# Patient Record
Sex: Male | Born: 1950 | Race: Black or African American | Hispanic: No | Marital: Married | Smoking: Former smoker
Health system: Southern US, Community
[De-identification: ages and names within clinical notes are randomized; demographics above are authoritative.]

## PROBLEM LIST (undated history)

## (undated) DIAGNOSIS — R0609 Other forms of dyspnea: Secondary | ICD-10-CM

## (undated) DIAGNOSIS — I493 Ventricular premature depolarization: Secondary | ICD-10-CM

## (undated) DIAGNOSIS — R0789 Other chest pain: Secondary | ICD-10-CM

## (undated) DIAGNOSIS — I2510 Atherosclerotic heart disease of native coronary artery without angina pectoris: Secondary | ICD-10-CM

## (undated) DIAGNOSIS — I10 Essential (primary) hypertension: Secondary | ICD-10-CM

## (undated) DIAGNOSIS — E785 Hyperlipidemia, unspecified: Secondary | ICD-10-CM

## (undated) DIAGNOSIS — R0600 Dyspnea, unspecified: Secondary | ICD-10-CM

## (undated) DIAGNOSIS — E119 Type 2 diabetes mellitus without complications: Secondary | ICD-10-CM

## (undated) HISTORY — DX: Hyperlipidemia, unspecified: E78.5

## (undated) HISTORY — DX: Ventricular premature depolarization: I49.3

## (undated) HISTORY — DX: Atherosclerotic heart disease of native coronary artery without angina pectoris: I25.10

## (undated) HISTORY — DX: Essential (primary) hypertension: I10

## (undated) HISTORY — DX: Other forms of dyspnea: R06.09

## (undated) HISTORY — DX: Other chest pain: R07.89

## (undated) HISTORY — DX: Dyspnea, unspecified: R06.00

## (undated) HISTORY — DX: Type 2 diabetes mellitus without complications: E11.9

---

## 1997-09-26 ENCOUNTER — Ambulatory Visit (HOSPITAL_COMMUNITY): Admission: RE | Admit: 1997-09-26 | Discharge: 1997-09-26 | Payer: Self-pay | Admitting: Pulmonary Disease

## 1999-04-05 HISTORY — PX: SPINAL FUSION: SHX223

## 1999-06-03 ENCOUNTER — Ambulatory Visit (HOSPITAL_COMMUNITY): Admission: RE | Admit: 1999-06-03 | Discharge: 1999-06-03 | Payer: Self-pay | Admitting: Pulmonary Disease

## 2000-02-23 ENCOUNTER — Encounter: Payer: Self-pay | Admitting: Cardiology

## 2000-02-23 ENCOUNTER — Ambulatory Visit (HOSPITAL_COMMUNITY): Admission: RE | Admit: 2000-02-23 | Discharge: 2000-02-23 | Payer: Self-pay | Admitting: Cardiology

## 2000-06-06 ENCOUNTER — Ambulatory Visit (HOSPITAL_COMMUNITY): Admission: RE | Admit: 2000-06-06 | Discharge: 2000-06-06 | Payer: Self-pay | Admitting: Neurosurgery

## 2000-06-06 ENCOUNTER — Encounter: Payer: Self-pay | Admitting: Neurosurgery

## 2000-06-23 ENCOUNTER — Encounter: Payer: Self-pay | Admitting: Neurosurgery

## 2000-06-27 ENCOUNTER — Inpatient Hospital Stay (HOSPITAL_COMMUNITY): Admission: RE | Admit: 2000-06-27 | Discharge: 2000-06-28 | Payer: Self-pay | Admitting: Neurosurgery

## 2000-06-27 ENCOUNTER — Encounter: Payer: Self-pay | Admitting: Neurosurgery

## 2000-07-18 ENCOUNTER — Encounter: Payer: Self-pay | Admitting: Neurosurgery

## 2000-07-18 ENCOUNTER — Encounter: Admission: RE | Admit: 2000-07-18 | Discharge: 2000-07-18 | Payer: Self-pay | Admitting: Neurosurgery

## 2000-08-18 ENCOUNTER — Encounter: Admission: RE | Admit: 2000-08-18 | Discharge: 2000-08-18 | Payer: Self-pay | Admitting: Neurosurgery

## 2000-08-18 ENCOUNTER — Encounter: Payer: Self-pay | Admitting: Neurosurgery

## 2000-09-28 ENCOUNTER — Encounter: Admission: RE | Admit: 2000-09-28 | Discharge: 2000-09-28 | Payer: Self-pay | Admitting: Cardiology

## 2000-09-28 ENCOUNTER — Encounter: Payer: Self-pay | Admitting: Cardiology

## 2000-10-13 ENCOUNTER — Ambulatory Visit (HOSPITAL_COMMUNITY): Admission: RE | Admit: 2000-10-13 | Discharge: 2000-10-13 | Payer: Self-pay | Admitting: Cardiology

## 2000-10-13 ENCOUNTER — Encounter: Payer: Self-pay | Admitting: Cardiology

## 2001-05-23 ENCOUNTER — Ambulatory Visit (HOSPITAL_COMMUNITY): Admission: RE | Admit: 2001-05-23 | Discharge: 2001-05-23 | Payer: Self-pay | Admitting: Cardiology

## 2001-05-23 ENCOUNTER — Encounter: Payer: Self-pay | Admitting: Cardiology

## 2002-04-08 ENCOUNTER — Encounter: Admission: RE | Admit: 2002-04-08 | Discharge: 2002-07-07 | Payer: Self-pay | Admitting: Cardiology

## 2010-02-13 ENCOUNTER — Ambulatory Visit (HOSPITAL_COMMUNITY): Admission: RE | Admit: 2010-02-13 | Discharge: 2010-02-13 | Payer: Self-pay | Admitting: Family Medicine

## 2010-02-13 ENCOUNTER — Emergency Department (HOSPITAL_COMMUNITY)
Admission: EM | Admit: 2010-02-13 | Discharge: 2010-02-13 | Payer: Self-pay | Source: Home / Self Care | Admitting: Family Medicine

## 2010-08-20 NOTE — H&P (Signed)
[place]. Herbert Miller  Patient:    Herbert, Miller                      MRN: [id number] Adm. Date:  [id number] Attending:  Josie Saunders                         History and Physical  REASON FOR ADMISSION:  Herniated cervical disk with cervical spondylosis and cervical radiculopathy.  HISTORY OF PRESENT ILLNESS:  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 Kari Miller for a neurosurgical consultation for neck and left arm pain.  I initially saw him on May 10, 2000, and [name] says he cannot sleep, and that it bothers him a lot.  He has been out of work for three weeks because of neck pain.  He previously saw Dr. Oretha Caprice at [facility], who recommended that he see a neurosurgeon for further evaluation and treatment.  [name] has undergone a prior lumbar fusion for a bulging disk in his low back by Dr. Oretha Caprice in 1996.  He says he did okay with that until about one year ago, and then has been having more left leg pain.  [name] currently complains of constant pain in his neck for greater than one year.  He says that it began in 2001, and then subsequently he developed left arm pain.  He says it has been very bad from October through December 2001, that it goes all the way down his arm, along with numbness in his left arm.  He says he has some pain in his legs which he feels is from his low back, along with numbness, but he says that when he walks this is relieved.  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 This occurred in 1979.  He had four surgeries on his hands to have his digits reattached.  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  He has healed from his hand quite well.  [name] was previously treated by Dr. Jearld Adjutant, who sent him to Dr. Oretha Caprice.  He  sees Dr. Eduardo Osier. Harwani for his heart, and has a history of angina.  He has also noted an increased heart rate.  He is diabetic.  [name] presented in the office on May 10, 2000, with an MRI of his cervical spine and of his left shoulder.  The cervical MRI was incomplete.  It was performed at [facility].  It showed multilevel spondylitic disease, particularly at the C5-6 and C6-7 levels, with questionable disease at the C4-5 level.  He did not have any reports from that study.  They were performed at the [facility] on February 08, 2000.  There is some stenosis at the C5-6 and C6-7 levels, without frank cord compression.  REVIEW OF SYSTEMS:  A detailed review of systems sheet was reviewed with the patient.  The pertinent positives include:  EARS/NOSE/MOUTH/THROAT:  He notes a sore throat.  CARDIOVASCULAR:  He notes chest pain, angina, high blood pressure, heart murmur, high cholesterol, swelling in his feet and hands, leg pain with walking.  RESPIRATORY:  He notes shortness of breath. GASTROINTESTINAL:  He notes indigestion or pain with eating, abdominal pain, change in his bowel habits.  GENITOURINARY:  He notes  difficulty starting and stopping his stream.  MUSCULOSKELETAL:  He notes leg weakness, back pain, arm pain, leg pain, neck pain.  NEUROLOGIC:  He notes problems with his memory and ability to concentrate, double and blurred vision.  PSYCHIATRIC:  He notes anxiety and depression.  ENDOCRINE:  Diabetes and excessive thirst and urination.  All other systems are negative.  PAST MEDICAL HISTORY: 1. High blood pressure. 2. Prior heart attack. 3. Diabetes.  PRIOR OPERATIONS/HOSPITALIZATIONS:  Significant for multilevel lumbar fusion with pedicular fixation, what appears to be Steffe plates in July 8295.  CURRENT MEDICATIONS: 1. K-Dur 20 mEq, two tablets q.d. 2. Lopressor once q.d. for high blood pressure. 3. Lipitor once q.d. 4. Norvasc once q.d. 5. Trazodone  one to two tablets at bedtime. 6. Hydrocodone one q.8h. p.r.n. pain.  ALLERGIES:  No known drug allergies.  HEIGHT & WEIGHT:  [name] is 6 feet 1 inch tall, weighing 225 pounds.  FAMILY HISTORY:  Mother is deceased, cause unspecified.  Father is age 73, in fair health with high blood pressure and diabetes.  SOCIAL HISTORY:  [name] is a four-cigarette-a-day smoker, a nondrinker of alcoholic beverages.  No history of substance abuse.  He works in transportation at [employer], and was out of work for three weeks prior to coming to see me.  DIAGNOSTIC STUDIES:  As above.  PHYSICAL EXAMINATION:  GENERAL:  [name] is a pleasant cooperative middle-aged black male, in no acute distress.  VITAL SIGNS:  Blood pressure 170/100 left arm seated, pulse 80 and regular.  HEENT:  Normocephalic, atraumatic.  Pupils equal, round, reactive to light. Extraocular movements intact.  Sclerae white.  Conjunctivae pink.  Oropharynx benign.  Uvula midline.  NECK:  No masses, meningismus, deformities, tracheal deviation, jugular venous distention, or carotid bruits.  He has a decreased range of motion on turning his head to the left, positive left-sided Spurlings maneuver, negative to the right.  Positive Lhermittes sign with axial compression causing an electrical shock into the left arm.  RESPIRATORY:  There is normal respiratory effort with good intercostal function.  The lungs are clear to auscultation.  There are no rales, rhonchi, or wheezes.  CARDIOVASCULAR:  The heart has a regular rate and rhythm to auscultation.  No murmurs are appreciated.  EXTREMITIES:  There is no cyanosis, clubbing, or edema.  There are palpable pedal pulses.  ABDOMEN:  Soft, nontender.  No hepatosplenomegaly appreciated, no masses. There are active bowel sounds.  No guarding or rebound.  MUSCULOSKELETAL:  He has had reattachment of his third through fifth digits on the left hand.  He had an  amputation of his second digit on the right.  He has negative shoulder impingement testing bilaterally.  Straight leg raise is positive at 60 degrees on the left.   NEUROLOGIC:  The patient is oriented to time, person, and place.  He has good recall of both recent and remote memory, with normal attention span and concentration.  The patient speaks with clear and fluent speech, and exhibits normal language function, and appropriate fund of knowledge.  Cranial nerve examination:  Pupils equal, round, reactive to light.  Extraocular movements are full.  Visual fields are full to confrontational testing.  Facial sensation and facial motor are intact and symmetric.  Hearing is intact to finger rub.  ______ is upgoing.  Shoulder shrug is symmetric.  Tongue protrudes in the midline.  Motor examination:  Motor strength is 5/5 in the bilateral hand grips, wrist extensors, interosseous, and  5/5 in the right deltoid, biceps, and triceps, and left deltoid is 4/5, left biceps 4/5, left triceps at 4-/5.  In the lower extremities motor strength is 5/5 in hip flexion, extension, quadriceps, hamstrings, plantar flexion, dorsiflexion, and extensor hallucis longus.  Sensory examination:  Normal to light touch and pinprick sensation in the upper and lower extremities, with the exception of decreased pin sensation in the left L5 distribution.  Deep tendon reflexes are 2 in the biceps and triceps on the right, absent on the left biceps, absent in the left triceps.  Knee jerks are absent.  Ankles jerks are 1 on the right and absent on the left.  Great toes are downgoing to plantar stimulation. Negative Hoffmans sign.  Cerebellar examination:  Normal coordination in the upper and lower extremities.  Normal rapid alternating movements.  Romberg test is negative.  IMPRESSION/RECOMMENDATIONS:  Mr. Rashan Rounsaville is a 60 year old man with neck and left upper extremity complaints.  He has weakness in his left  upper extremity, positive Spurlings maneuver, and appears to have multilevel disease in his cervical spine.  Because of the incomplete MRI, and we were unable to obtain the repeat copy of the MRI, went ahead and did a cervical myelography.  The cervical myelogram and post-myelographic CT demonstrates significant degenerative disease at the C4-5, C5-6, and C6-7 levels, most severe at the C5-6 and C6-7 levels.  Based on those imaging studies, as well as significant radicular complaints involving the C5, C6, and C7 nerve roots, I have recommended that he undergo an anterior cervical diskectomy and fusion at the C4-5, C5-6, and C6-7 levels. I went over the diagnostic studies in detail with him, and reviewed the surgical models and also discussed the exact nature of the surgical procedure, attendant risks, essential benefits, and typical operative and postoperative course.  I discussed the risks of surgery, which include but are not limited to, the risks of anesthesia, blood loss, infection, injury to various neck structures including trachea and esophagus which could cause either temporary or permanent swallowing difficulties, and also the potential for perforation of the esophagus which might require operative intervention, larynx, recurrent laryngeal nerve which could cause either temporary or permanent vocal cord paralysis resulting in either temporary or permanent voice changes, injury to the cervical nerve roots, which could cause either temporary or permanent arm pain, numbness, and/or weakness.  There is a small chance of injury to the spinal cord which could cause paralysis.  There is also the potential for malplacement of instrumentation, fusion failure, need for repeat surgery, degenerative disease at other levels in the neck, failure to relieve pain, and worsening of the pain.  I also discussed with the patient that he will lose some neck mobility from his surgery, his typical stay  in the hospital overnight after this operation, and he will not be able to drive for two weeks after surgery, and will come back to see me two weeks following surgery, and then with a lateral C-spine x-ray, and then for monthly visits for up to three months after surgery.  I went over the patients lumbar myelogram and post-myelographic CT.  I told him that this did not show any significant abnormalities in nerve root filling, or abnormalities of the lumbar fusion.  He wished to go ahead with the surgery.  This is scheduled for June 27, 2000. DD:  06/27/00 TD:  06/27/00 Job: [id number]

## 2010-08-20 NOTE — Op Note (Signed)
[place]. [facility]  Patient:    Herbert Miller, Herbert Miller                      MRN: [id number] Proc. Date: 06/27/00 Adm. Date:  [id number] Attending:  Josie Saunders                           Operative Report  PREOPERATIVE DIAGNOSES:  Herniated cervical disk with cervical spondylosis and degenerative disk disease, cervical radiculopathy at C4-5 at C5-6, and C6-7 levels.  POSTOPERATIVE DIAGNOSES:  Herniated cervical disk with cervical spondylosis and degenerative disk disease, cervical radiculopathy at C4-5 at C5-6, and C6-7 levels.  PROCEDURE:  Anterior cervical diskectomy and fusion, C4-5, C5-6, and C6-7 levels with allograft bone graft and an anterior cervical plate.  SURGEON:  Danae Orleans. Venetia Maxon, M.D.  ASSISTANT:  Stefani Dama, M.D.  ANESTHESIA:  General endotracheal anesthesia.  ESTIMATED BLOOD LOSS:  100 cc.  COMPLICATIONS:  None.  DISPOSITION:  Recovery.  INDICATIONS:  Herbert Miller is a 60 year old man with neck and left upper extremity pain and weakness with significant degenerative disease involving the C4-5 to a lesser degree and at the C5-6 and C6-7 levels to a greater degree with spondylosis and foraminal stenosis along with spinal stenosis at these levels.  We have elected to take him to surgery for an anterior cervical diskectomy and fusion.  PROCEDURE:  Herbert Miller was brought to the operating room.  Following a satisfactory and uncomplicated induction of general endotracheal anesthesia and placement of intravenous line the patient was placed in a supine position on the operating table.  His neck was placed in slight  extension.  He was placed in 10 pounds of Holter traction.  His anterior neck was then prepped and draped in the usual sterile fashion.  The area of planned incision was infiltrated with 0.25% Marcaine and 0.50% lidocaine and 1:200,000 epinephrine. An incision was made from the midline to the anterior border of  the sternocleidomastoid muscle carried sharply through platysmal layer. Subplatysmal dissection was performed and then blunt dissection was performed along the anterior border of the sternocleidomastoid muscle keeping the trachea and esophagus medial and the carotid sheath lateral exposing the anterior cervical spine.  A spinal needle was placed at was felt to be the C4-5 level and this was confirmed on intraoperative x-ray.  The longus coli muscles were then taken down from the anterior cervical spine from C4 to C7 levels bilaterally with electrocautery and a key elevator.  The shadowline self-retaining retractor was placed to facilitate exposure keeping the retractor blade under the cuffs of the longus coli muscles.  Initially the C5-6 and C6-7 levels were operated on.  Large ventral osteophytes were removed with leksell rongeur and the disk space at each of these levels was incised with a 15 blade and disk material was removed in a piecemeal fashion.  Subsequently using a variety of Carlen microcurets the end plates were decorticated and remaining disk material was removed down to the level of the posterior longitudinal ligament.  Disk space spreaders were placed to facilitate exposure.  Initially at the C6-7 level the end plates were drilled with the L8 end bur and minimax drill under microscopic visualization.  Subsequently uncinate spurs were drilled down and the hypertrophied redundant posterior longitudinal ligament was then removed in a piecemeal fashion decompressing the central spinal cord dura, and subsequently the C7 nerve roots widely as they extended out  the neural foramina.  Great care was taken to decompress the left C7 nerve root which was significantly compressed by a large uncinate spur which was drilled away.  Subsequently hemostasis was assured with Gelfoam soaked in thrombin and a 7 mm allograft iliac crest bone graft was then cut to the appropriate depth, then  inserted and countersunk appropriately in the C6-7 interspace.  Attention was then turned to the C5-6 level where a similar decompression was performed.  Again, large uncinate spurs were drilled down with high-speed drill.  The C6 nerve roots were decompressed widely as they extended out the neural foramina.  A similarly sized allograft was then inserted and countersunk appropriately.  Attention was then turned to the C4-5 level where ventral osteophytes were removed.  Disk space was incised with a 15 blade.  Disk material was removed in a piecemeal fashion.  This level was not as degenerated as the other levels but there was a significant ventral shelf of osteophytic bone which was removed.  The disk space spreaders were placed and using microscopic visualization the end plates of C4 and C5 were decorticated with a high-speed drill and posterior longitudinal ligament was incised and removed in a piecemeal fashion using a variety of gold tip Kerrison rongeurs.  Care was taken not to instrument the C4-5 neural foramina out of concern for the fragility C5 nerve roots bilaterally.  Nonetheless the foramina were felt to be well decompressed.  Hemostasis was again assured with Gelfoam soaked in thrombin and a similarly size 7 mm bone graft was inserted and countersunk appropriately.  The patient was taken out of 10 pounds of Holter traction and a 52 mm tether anterior cervical plate was then sized and found to fit appropriately overlying the C4 through C7 vertebral levels.  This was affixed to the anterior cervical spine with 2, 14 x 4 mm variable angled screws at C4, 2 similarly sized screws at C7, and a single intervening screw at C5 and C6 levels.  All screws had excellent purchase, locking mechanisms were engaged.  The final x-ray confirmed positioning of bone graft of the anterior cervical plate.  The self-retaining retractor was removed.  Microscope was taken out of the field.  Using  great care the soft tissues were inspected and found to be in good repair. Hemostasis was assured and the wound was copiously irrigated with bacitracin and saline.  There was no evidence of any residual bleeding.   The platysmal layer was then closed with 3-0 Vicryl sutures.  Subcuticular layers were reapproximated with 4-0 Vicryl subcuticular stitch.  The wound was dressed with benzoin and Steri-Strips, Telfa gauze and tape.  The patient was extubated in the operating room and taken to the recovery room in stable satisfactory condition having tolerated his operation well.  Counts were correct at the end of the case. DD:  06/27/00 TD:  06/27/00 Job: [id number]

## 2011-07-01 IMAGING — CR DG HAND COMPLETE 3+V*L*
3 series · 3 of 3 positions shown · non-contrast
Comparison: None.

CLINICAL DATA: Left hand pain and swelling.  Finger stiffness.

LEFT HAND - COMPLETE 3+ VIEW

[x hand pa left]
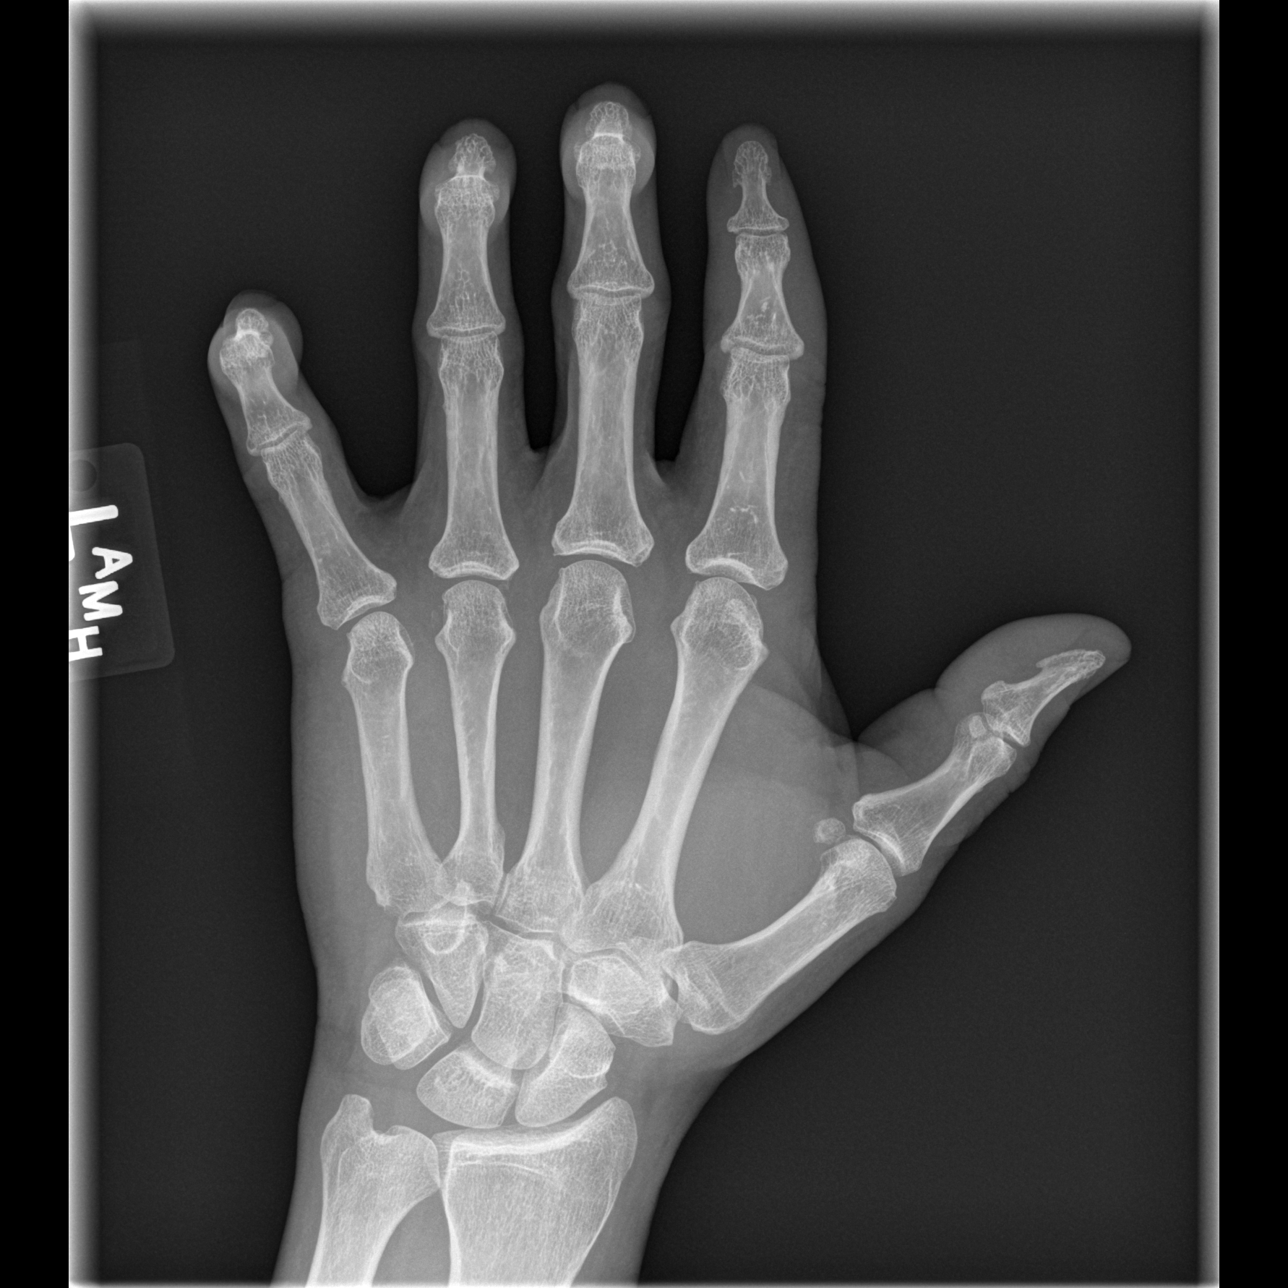

[x hand oblique left]
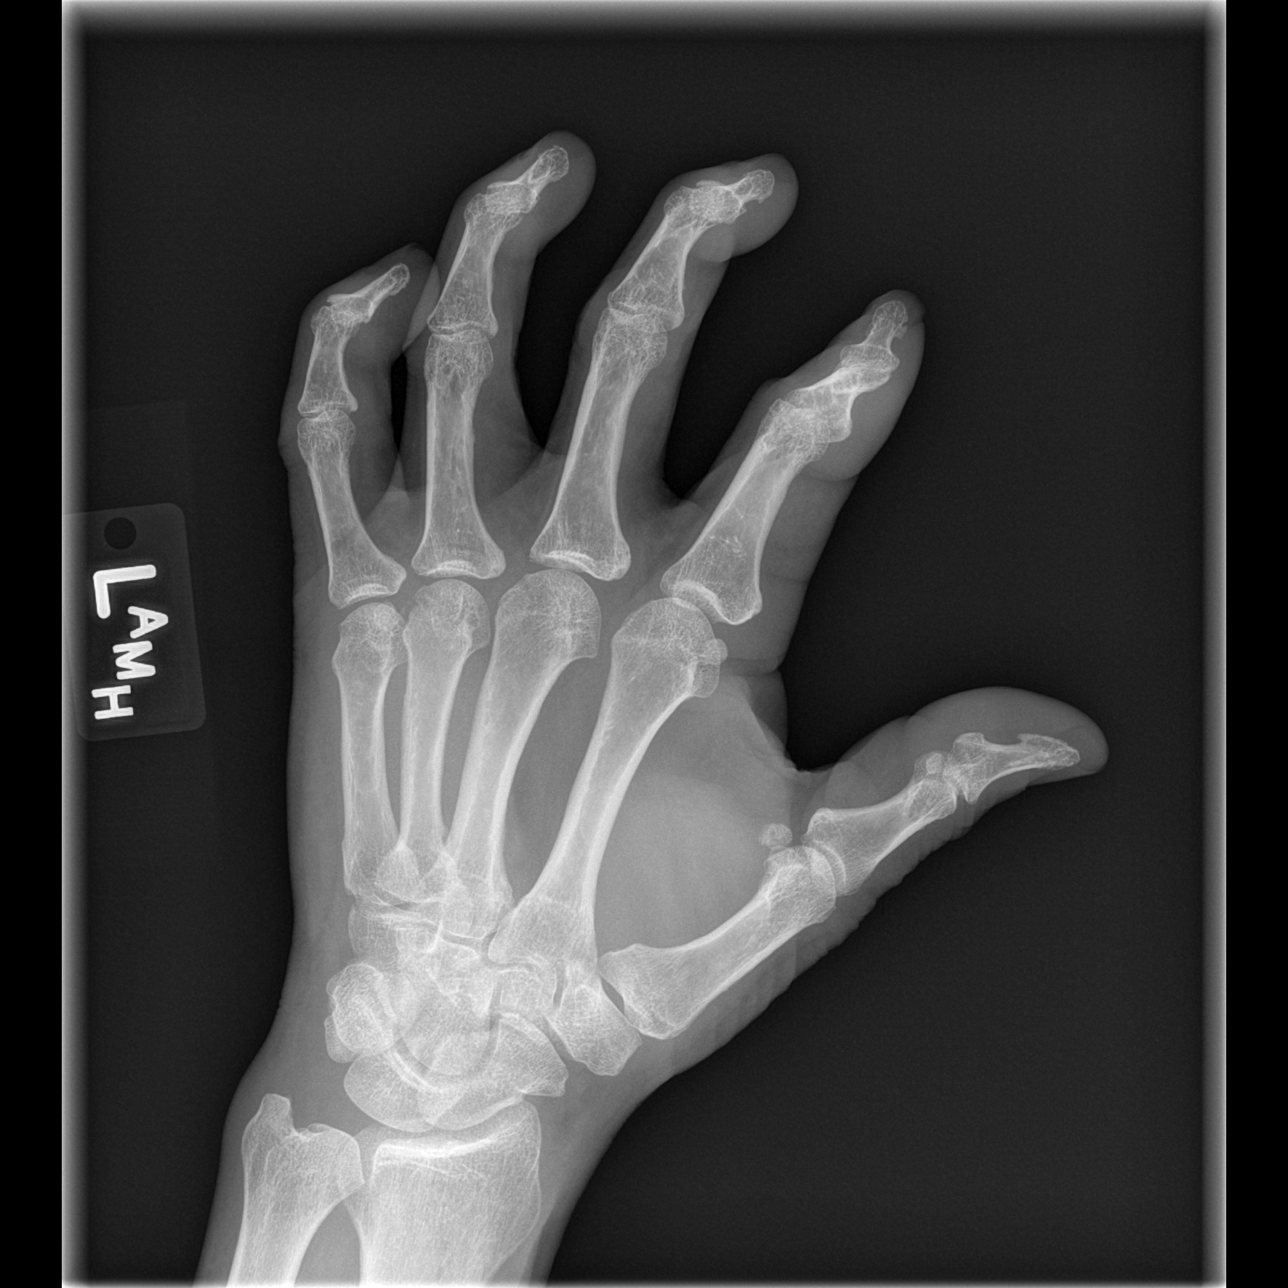

[x hand lat left]
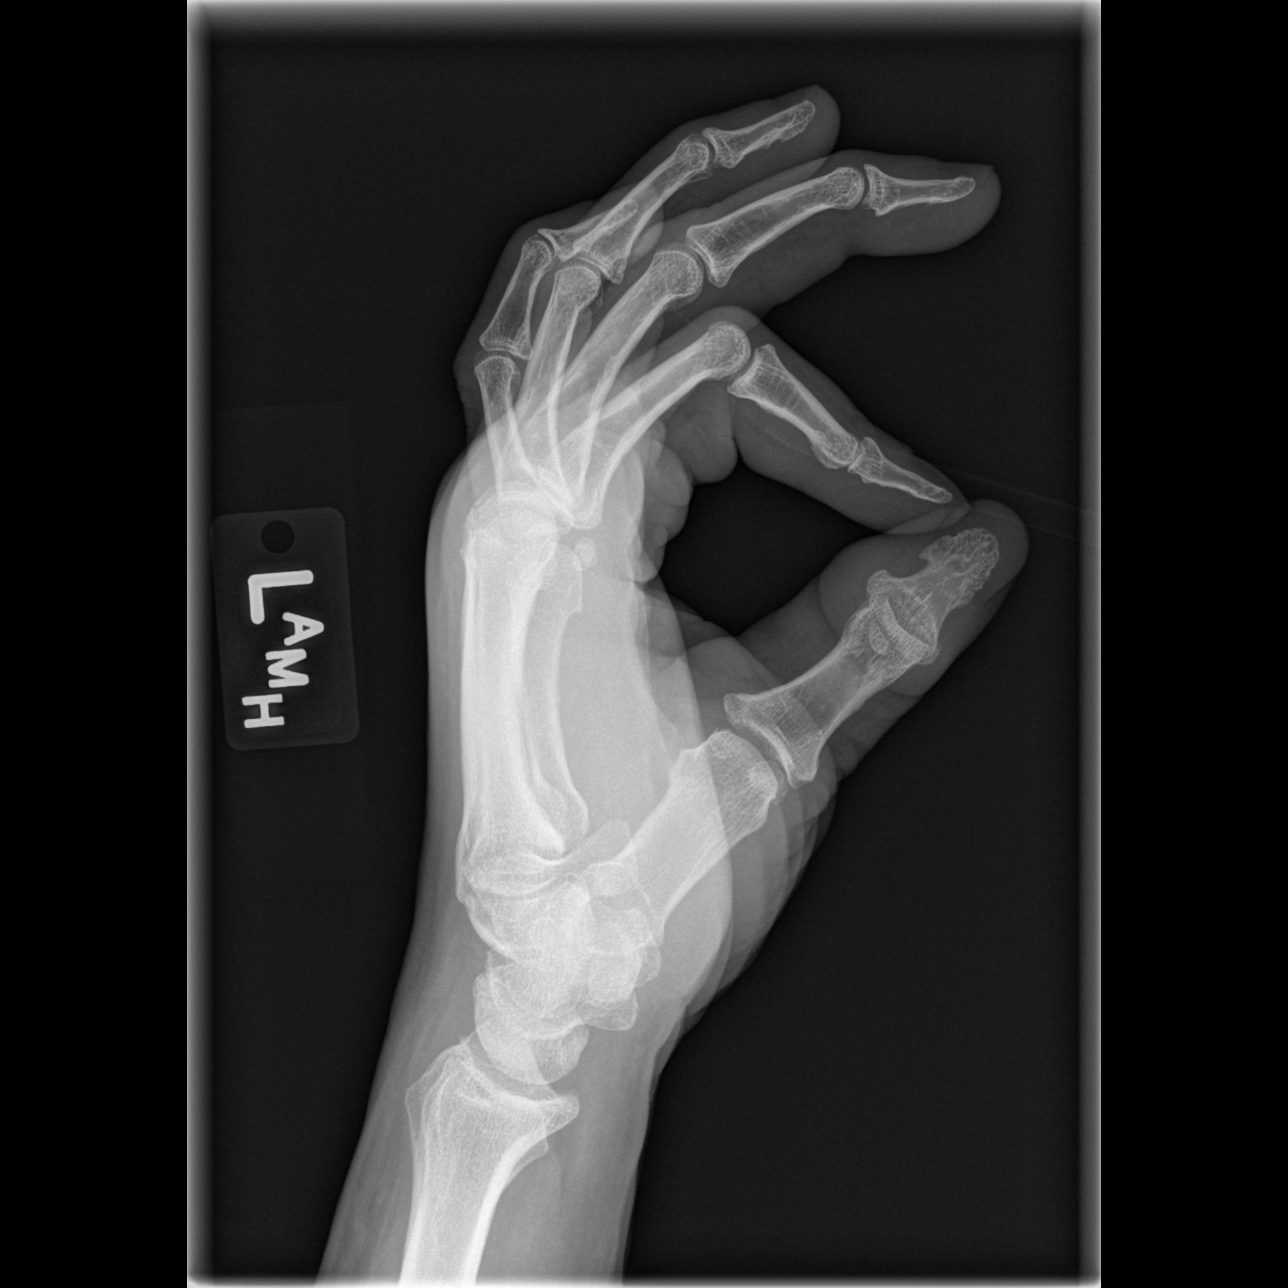

[3 of 3 positions shown; findings below may reference images not displayed]

FINDINGS: No evidence for acute fracture.  No subluxation or
dislocation.  No worrisome lytic or sclerotic osseous abnormality.
IMPRESSION: No acute bony findings.

## 2012-02-24 ENCOUNTER — Other Ambulatory Visit (HOSPITAL_COMMUNITY): Payer: Self-pay | Admitting: Cardiovascular Disease

## 2012-02-24 DIAGNOSIS — R0989 Other specified symptoms and signs involving the circulatory and respiratory systems: Secondary | ICD-10-CM

## 2012-02-24 DIAGNOSIS — I10 Essential (primary) hypertension: Secondary | ICD-10-CM

## 2012-02-24 DIAGNOSIS — R9431 Abnormal electrocardiogram [ECG] [EKG]: Secondary | ICD-10-CM

## 2012-02-28 ENCOUNTER — Ambulatory Visit (HOSPITAL_COMMUNITY)
Admission: RE | Admit: 2012-02-28 | Discharge: 2012-02-28 | Disposition: A | Discharge status: Home / Self Care | Payer: BC Managed Care – PPO | Source: Ambulatory Visit | Attending: Cardiovascular Disease | Admitting: Cardiovascular Disease

## 2012-02-28 DIAGNOSIS — I379 Nonrheumatic pulmonary valve disorder, unspecified: Secondary | ICD-10-CM | POA: Insufficient documentation

## 2012-02-28 DIAGNOSIS — E119 Type 2 diabetes mellitus without complications: Secondary | ICD-10-CM | POA: Insufficient documentation

## 2012-02-28 DIAGNOSIS — I079 Rheumatic tricuspid valve disease, unspecified: Secondary | ICD-10-CM | POA: Insufficient documentation

## 2012-02-28 DIAGNOSIS — R0989 Other specified symptoms and signs involving the circulatory and respiratory systems: Secondary | ICD-10-CM | POA: Insufficient documentation

## 2012-02-28 DIAGNOSIS — I10 Essential (primary) hypertension: Secondary | ICD-10-CM | POA: Insufficient documentation

## 2012-02-28 DIAGNOSIS — I252 Old myocardial infarction: Secondary | ICD-10-CM | POA: Insufficient documentation

## 2012-02-28 DIAGNOSIS — E785 Hyperlipidemia, unspecified: Secondary | ICD-10-CM | POA: Insufficient documentation

## 2012-02-28 DIAGNOSIS — I2510 Atherosclerotic heart disease of native coronary artery without angina pectoris: Secondary | ICD-10-CM | POA: Insufficient documentation

## 2012-02-28 DIAGNOSIS — I080 Rheumatic disorders of both mitral and aortic valves: Secondary | ICD-10-CM | POA: Insufficient documentation

## 2012-02-28 DIAGNOSIS — R0609 Other forms of dyspnea: Secondary | ICD-10-CM | POA: Insufficient documentation

## 2012-02-28 NOTE — Progress Notes (Signed)
[facility]   2D echo completed 02/28/2012.   Cindy Irven Ingalsbe, RDCS   

## 2012-03-13 ENCOUNTER — Encounter (HOSPITAL_COMMUNITY): Payer: Self-pay

## 2012-04-10 ENCOUNTER — Encounter (HOSPITAL_COMMUNITY): Payer: Self-pay

## 2012-11-29 ENCOUNTER — Ambulatory Visit: Payer: BC Managed Care – PPO | Admitting: Cardiology

## 2013-01-22 ENCOUNTER — Encounter: Payer: Self-pay | Admitting: Cardiovascular Disease

## 2015-04-02 DIAGNOSIS — C342 Malignant neoplasm of middle lobe, bronchus or lung: Secondary | ICD-10-CM | POA: Insufficient documentation

## 2015-04-02 DIAGNOSIS — E0965 Drug or chemical induced diabetes mellitus with hyperglycemia: Secondary | ICD-10-CM | POA: Insufficient documentation

## 2015-04-02 DIAGNOSIS — I5032 Chronic diastolic (congestive) heart failure: Secondary | ICD-10-CM | POA: Insufficient documentation

## 2015-04-02 DIAGNOSIS — C7931 Secondary malignant neoplasm of brain: Secondary | ICD-10-CM | POA: Insufficient documentation

## 2015-04-02 DIAGNOSIS — E785 Hyperlipidemia, unspecified: Secondary | ICD-10-CM | POA: Insufficient documentation

## 2015-04-02 DIAGNOSIS — I10 Essential (primary) hypertension: Secondary | ICD-10-CM | POA: Insufficient documentation

## 2015-04-24 DIAGNOSIS — C7951 Secondary malignant neoplasm of bone: Secondary | ICD-10-CM | POA: Insufficient documentation

## 2015-07-30 DIAGNOSIS — E876 Hypokalemia: Secondary | ICD-10-CM | POA: Insufficient documentation

## 2016-01-15 DIAGNOSIS — R29898 Other symptoms and signs involving the musculoskeletal system: Secondary | ICD-10-CM | POA: Insufficient documentation

## 2016-01-25 DIAGNOSIS — M48061 Spinal stenosis, lumbar region without neurogenic claudication: Secondary | ICD-10-CM | POA: Insufficient documentation

## 2016-01-25 DIAGNOSIS — R269 Unspecified abnormalities of gait and mobility: Secondary | ICD-10-CM | POA: Insufficient documentation

## 2016-01-25 DIAGNOSIS — R29898 Other symptoms and signs involving the musculoskeletal system: Secondary | ICD-10-CM | POA: Insufficient documentation

## 2016-01-25 DIAGNOSIS — Z981 Arthrodesis status: Secondary | ICD-10-CM | POA: Insufficient documentation

## 2016-02-12 DIAGNOSIS — G4700 Insomnia, unspecified: Secondary | ICD-10-CM | POA: Insufficient documentation

## 2016-02-12 DIAGNOSIS — G4733 Obstructive sleep apnea (adult) (pediatric): Secondary | ICD-10-CM | POA: Insufficient documentation

## 2016-02-12 DIAGNOSIS — Z8679 Personal history of other diseases of the circulatory system: Secondary | ICD-10-CM | POA: Insufficient documentation

## 2016-02-12 DIAGNOSIS — K219 Gastro-esophageal reflux disease without esophagitis: Secondary | ICD-10-CM | POA: Insufficient documentation

## 2016-02-19 DIAGNOSIS — I6789 Other cerebrovascular disease: Secondary | ICD-10-CM | POA: Insufficient documentation

## 2016-02-19 DIAGNOSIS — G936 Cerebral edema: Secondary | ICD-10-CM | POA: Insufficient documentation

## 2016-02-19 DIAGNOSIS — Y842 Radiological procedure and radiotherapy as the cause of abnormal reaction of the patient, or of later complication, without mention of misadventure at the time of the procedure: Secondary | ICD-10-CM

## 2016-03-17 DIAGNOSIS — R4182 Altered mental status, unspecified: Secondary | ICD-10-CM | POA: Insufficient documentation

## 2016-04-15 LAB — HEPATIC FUNCTION PANEL
ALK PHOS: 67 U/L (ref 25–125)
ALT: 26 U/L (ref 10–40)
AST: 16 U/L (ref 14–40)
Bilirubin, Total: 0.7 mg/dL

## 2016-04-15 LAB — BASIC METABOLIC PANEL
BUN: 40 mg/dL — AB (ref 4–21)
CREATININE: 1 mg/dL (ref 0.6–1.3)
Glucose: 259 mg/dL
POTASSIUM: 4.4 mmol/L (ref 3.4–5.3)
SODIUM: 137 mmol/L (ref 137–147)

## 2016-04-15 LAB — POCT INR: INR: 1.1 (ref 0.9–1.1)

## 2016-04-19 LAB — BASIC METABOLIC PANEL
BUN: 27 mg/dL — AB (ref 4–21)
CREATININE: 1 mg/dL (ref 0.6–1.3)
Glucose: 166 mg/dL
Potassium: 3.5 mmol/L (ref 3.4–5.3)
SODIUM: 140 mmol/L (ref 137–147)

## 2016-04-19 LAB — HEPATIC FUNCTION PANEL
ALK PHOS: 58 U/L (ref 25–125)
ALT: 41 U/L — AB (ref 10–40)
AST: 29 U/L (ref 14–40)
Bilirubin, Total: 0.6 mg/dL

## 2016-04-19 LAB — CBC AND DIFFERENTIAL
HCT: 43 % (ref 41–53)
HEMOGLOBIN: 14.5 g/dL (ref 13.5–17.5)
Neutrophils Absolute: 4 /uL
PLATELETS: 156 10*3/uL (ref 150–399)
WBC: 6.1 10^3/mL

## 2016-04-20 LAB — BASIC METABOLIC PANEL
BUN: 27 mg/dL — AB (ref 4–21)
CREATININE: 0.8 mg/dL (ref 0.6–1.3)
GLUCOSE: 253 mg/dL
POTASSIUM: 3.6 mmol/L (ref 3.4–5.3)
SODIUM: 140 mmol/L (ref 137–147)

## 2016-04-20 LAB — CBC AND DIFFERENTIAL
HEMATOCRIT: 42 % (ref 41–53)
Hemoglobin: 14.5 g/dL (ref 13.5–17.5)
Platelets: 146 10*3/uL — AB (ref 150–399)
WBC: 6.8 10^3/mL

## 2016-04-21 LAB — BASIC METABOLIC PANEL
BUN: 28 mg/dL — AB (ref 4–21)
Creatinine: 0.8 mg/dL (ref 0.6–1.3)
GLUCOSE: 191 mg/dL
Potassium: 3.7 mmol/L (ref 3.4–5.3)
SODIUM: 141 mmol/L (ref 137–147)

## 2016-04-21 LAB — CBC AND DIFFERENTIAL
HEMATOCRIT: 42 % (ref 41–53)
Hemoglobin: 14.1 g/dL (ref 13.5–17.5)
PLATELETS: 134 10*3/uL — AB (ref 150–399)
WBC: 8.5 10*3/mL

## 2016-04-22 LAB — BASIC METABOLIC PANEL
BUN: 22 mg/dL — AB (ref 4–21)
CREATININE: 0.7 mg/dL (ref 0.6–1.3)
Glucose: 137 mg/dL
Potassium: 3.3 mmol/L — AB (ref 3.4–5.3)
Sodium: 140 mmol/L (ref 137–147)

## 2016-04-22 LAB — CBC AND DIFFERENTIAL
HEMATOCRIT: 43 % (ref 41–53)
Hemoglobin: 14.4 g/dL (ref 13.5–17.5)
PLATELETS: 122 10*3/uL — AB (ref 150–399)
WBC: 7.9 10*3/mL

## 2016-04-25 ENCOUNTER — Encounter: Payer: Self-pay | Admitting: Adult Health

## 2016-04-25 ENCOUNTER — Non-Acute Institutional Stay (SKILLED_NURSING_FACILITY): Payer: Medicare Other | Admitting: Adult Health

## 2016-04-25 DIAGNOSIS — C7931 Secondary malignant neoplasm of brain: Secondary | ICD-10-CM | POA: Diagnosis not present

## 2016-04-25 DIAGNOSIS — Z7409 Other reduced mobility: Secondary | ICD-10-CM | POA: Insufficient documentation

## 2016-04-25 DIAGNOSIS — E0965 Drug or chemical induced diabetes mellitus with hyperglycemia: Secondary | ICD-10-CM

## 2016-04-25 DIAGNOSIS — E876 Hypokalemia: Secondary | ICD-10-CM

## 2016-04-25 DIAGNOSIS — I6789 Other cerebrovascular disease: Secondary | ICD-10-CM

## 2016-04-25 DIAGNOSIS — Y842 Radiological procedure and radiotherapy as the cause of abnormal reaction of the patient, or of later complication, without mention of misadventure at the time of the procedure: Secondary | ICD-10-CM

## 2016-04-25 DIAGNOSIS — I110 Hypertensive heart disease with heart failure: Secondary | ICD-10-CM | POA: Diagnosis not present

## 2016-04-25 DIAGNOSIS — K219 Gastro-esophageal reflux disease without esophagitis: Secondary | ICD-10-CM | POA: Diagnosis not present

## 2016-04-25 DIAGNOSIS — C342 Malignant neoplasm of middle lobe, bronchus or lung: Secondary | ICD-10-CM

## 2016-04-25 NOTE — Progress Notes (Signed)
Location:  [place] Room Number: [place] of Service:  SNF (31)   CODE STATUS: Full Code  No Known Allergies  Chief Complaint  Patient presents with  . 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 Prior to his hospitalization he had increasing weakness; increased incontinence and headache. He is unable to fully participate in the hpi or ros. There are no nursing concerns at this time.    Past Medical History:  Diagnosis Date  . CAD (coronary artery disease)   . Chest pain, atypical   . Dyspnea on exertion    2D Echo, 02/28/2012 - EF 60-65%, severe concentric hypertrophy, aortic valve moderately calcified,   . Hyperlipidemia   . Hypertension    Renal Doppler, 02/09/2009 - normal  . Non-insulin dependent type 2 diabetes mellitus ([place])   . PVC (premature ventricular contraction)    NUC, 02/09/2009 - No scintigraphic eivdence of inducible moycardial ischemia, post-stress EF 55%, EKG negative for ischemia, ne ECG changes    Past Surgical History:  Procedure Laterality Date  . SPINAL FUSION  2001    Social History   Social History  . Marital status: Single    Spouse name: N/A  . Number of children: N/A  . Years of education: N/A   Occupational History  . Not on file.   Social History Main Topics  . Smoking status: Former Research scientist (life sciences)  . Smokeless tobacco: Never Used  . Alcohol use No  . Drug use: No  . Sexual activity: Not Currently   Other Topics Concern  . Not on file   Social History Narrative  . No narrative on file   Family History  Problem Relation Age of Onset  . Stroke Mother 23  . Diabetes Mother   . Diabetes Sister   . Diabetes Brother   . Diabetes Brother   . Diabetes Sister       VITAL SIGNS BP (!) 160/85   Pulse 86   Temp 97.1 F (36.2 C)   Resp 16   SpO2 95%   Patient's  Medications  New Prescriptions   No medications on file  Previous Medications   AMLODIPINE (NORVASC) 10 MG TABLET    Take 10 mg by mouth daily.   ATORVASTATIN (LIPITOR) 40 MG TABLET    Take 40 mg by mouth daily.   BIOTIN 5 MG CAPS    Take 5 mg by mouth.   CALCIUM CARBONATE (CALCIUM 600) 600 MG TABS TABLET    Take 1,500 mg by mouth.   CANAGLIFLOZIN (INVOKANA) 100 MG TABS TABLET    Take 100 mg by mouth.   CHOLECALCIFEROL (VITAMIN D-1000 MAX ST) 1000 UNITS TABLET    Take by mouth.   DEXAMETHASONE (DECADRON) 2 MG TABLET    Take '4mg'$  (2 tabs) in the morning and '2mg'$  (1 tab) in the evening  1/19-1/24, then '2mg'$  twice daily 1/25-1/31, then '2mg'$  daily 2/1-2/7, then '2mg'$  every other day 2/8-2/14, then stop.   FOLIC ACID (FOLVITE) 623 MCG TABLET    Take by mouth.   INSULIN GLARGINE (LANTUS) 100 UNIT/ML INJECTION    Inject 10 Units into the skin at bedtime.   INSULIN LISPRO (HUMALOG) 100 UNIT/ML INJECTION    Inject into the skin.   LOSARTAN (COZAAR) 100 MG TABLET    Take 100 mg by mouth daily.  METFORMIN (GLUCOPHAGE) 500 MG TABLET    Take 500 mg by mouth 2 (two) times daily with a meal.   METOPROLOL (TOPROL-XL) 200 MG 24 HR TABLET    Take 200 mg by mouth.   ONDANSETRON (ZOFRAN) 8 MG TABLET    TAKE 1 TABLET BY MOUTH 2 TIMES DAILY FOR 2 DAYS AFTER CHEMOTHERAPY, THEN 1 TABLET 2 TIMES DAILY FOR NAUSEA AS NEEDED   PANTOPRAZOLE (PROTONIX) 40 MG TABLET    Take 40 mg by mouth.   POTASSIUM CHLORIDE SA (K-DUR,KLOR-CON) 20 MEQ TABLET    Take 20 mEq by mouth daily.   SENNA-DOCUSATE (SENOKOT-S) 8.6-50 MG TABLET    Take 2 tablets by mouth at bedtime.   SPECIALTY VITAMINS PRODUCTS (MG-PLUS PROTEIN PO)    1 capsule with a meal   TRIAMTERENE-HYDROCHLOROTHIAZIDE (MAXZIDE-25) 37.5-25 MG PER TABLET    Take 1 tablet by mouth daily.  Modified Medications   No medications on file  Discontinued Medications   CLONIDINE (CATAPRES) 0.1 MG TABLET    Take 0.1 mg by mouth 2 (two) times daily.   METOPROLOL (LOPRESSOR) 100 MG TABLET     Take 100 mg by mouth 2 (two) times daily.     SIGNIFICANT DIAGNOSTIC EXAMS  04-15-16: chest x-ray: no evidence of acute cardiac or pulmonary abnormality  04-15-16; ct of head: 1. New low density focus in the right cerebellum, concerning of new metastatic lesion or focus of acute/subacute ischemia. MRI is recommended.  2. overall similar appearance of treated right frontal parietal metastatic lesion comparing with Ct dated 03-16-16.  3. Similar back ground of chronic microvascular ischemic changes and cerebral atrophy   04-16-16: MRI of brain: 1. Finding questioned on prior head CT in the right cerebellum is consistent with remote cerebellar infarct which ash been present on multiple prior exams. No new enhancing lesions.  2. Decreased peripheral enhancement associated with largest high right parietal lobe metastasis which may reflect posttreatment effects. Lesion is similar in size.  3. Decreased size of right right frontal lobe metastasis. Similar size of right occipital dural based and right cerebellar lobe metastasis  4. No evidence for superimposed acute abnormality   LABS REVIEWED:   04-15-16: blood culture; no growth; urine culture: 30,000 yeast  04-16-16: glucose 259; bun 40; creat 1.02; k+ 4.4; na++ 137; liver normal albumin 3.3; phos 1.8; mag 2.2 04-19-16: wbc 6.1; hgb 14.5; hct 42.5; plt 156 glucose 166; bun 27; creat 0.76; k+ 3.5; na++ 140; liver normal albumin 3.3; phos 1.8; mag 2.1  04-22-16: wbc 7.9; hgb 14.4; hct 42.7; mcv 94.9; plt 122; glucose 137; bun 22; creat 0.69; k+ 3.3; na++ 140    Review of Systems  Unable to perform ROS: Medical condition    Physical Exam  Constitutional: No distress.  Thin   Eyes: Conjunctivae are normal.  Neck: Neck supple. No JVD present. No thyromegaly present.  Cardiovascular: Normal rate, regular rhythm and intact distal pulses.   Respiratory: Effort normal and breath sounds normal. No respiratory distress. He has no wheezes.  GI: Soft.  Bowel sounds are normal. He exhibits no distension. There is no tenderness.  Musculoskeletal: He exhibits edema.  Able to move all extremities  1+ lower extremity edema   Lymphadenopathy:    He has no cervical adenopathy.  Neurological: He is alert.  Skin: Skin is warm and dry. He is not diaphoretic.  Psychiatric: He has a normal mood and affect.     ASSESSMENT/ PLAN:  1. Hypertension: will continue norvasc 10 mg  daily cozaar 100 mg daily maxzide 37.5/25 mg daily toprol xl 200 mg daily   2. Diastolic heart failure: will continue spironolactone 25 mg daily toprol xl 200 mg daily maxzide 37.5/25 mg daily   3. Hypokalemia: will continue k+ 20 meq twice daily   4. Diabetes: will continue lantus 10 units nightly metformin 500 mg twice daily; invokana 100 mg daily  humalog SSI:   5.dyslipidemia: will continue lipitor 40 mg daily  6.  GERD: will continue protonix 40 mg daily   7. Constipation: will continue senna S 2 tabs daily   8. 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 Will continue dexamethasone taper dose will continue zofran 8 mg every 8 hours as needed   Time spent with patient  50  minutes >50% time spent counseling; reviewing medical record; tests; labs; and developing future plan of care   MD is aware of resident's narcotic use and is in agreement with current plan of care. We will attempt to wean resident as apropriate   Ok Edwards NP [facility]  Contact [phone] Monday through Friday 8am- 5pm  After hours call [phone]

## 2016-04-26 ENCOUNTER — Encounter: Payer: Self-pay | Admitting: Internal Medicine

## 2016-04-26 ENCOUNTER — Non-Acute Institutional Stay (SKILLED_NURSING_FACILITY): Payer: Medicare Other | Admitting: Internal Medicine

## 2016-04-26 DIAGNOSIS — E876 Hypokalemia: Secondary | ICD-10-CM

## 2016-04-26 DIAGNOSIS — G893 Neoplasm related pain (acute) (chronic): Secondary | ICD-10-CM | POA: Diagnosis not present

## 2016-04-26 DIAGNOSIS — I5032 Chronic diastolic (congestive) heart failure: Secondary | ICD-10-CM

## 2016-04-26 DIAGNOSIS — Z8673 Personal history of transient ischemic attack (TIA), and cerebral infarction without residual deficits: Secondary | ICD-10-CM

## 2016-04-26 DIAGNOSIS — K219 Gastro-esophageal reflux disease without esophagitis: Secondary | ICD-10-CM

## 2016-04-26 DIAGNOSIS — E0965 Drug or chemical induced diabetes mellitus with hyperglycemia: Secondary | ICD-10-CM | POA: Diagnosis not present

## 2016-04-26 DIAGNOSIS — C7931 Secondary malignant neoplasm of brain: Secondary | ICD-10-CM | POA: Diagnosis not present

## 2016-04-26 DIAGNOSIS — I110 Hypertensive heart disease with heart failure: Secondary | ICD-10-CM

## 2016-04-26 NOTE — Progress Notes (Signed)
Patient ID: Herbert Miller, male   DOB: [birth year], 66 y.o.   MRN: [id number]    HISTORY AND PHYSICAL   DATE:  04/26/2016  Location:    [place] Room Number: [address] Place of Service: SNF (31)   Extended Emergency Contact Information Primary Emergency Contact: Chapel,Jane Address: [place], [place] Phone: [phone] Work Phone: [phone] Relation: Other  Advanced Directive information Does Patient Have a Medical Advance Directive?: No, Would patient like information on creating a medical advance directive?: No - Patient declined  Chief Complaint  Patient presents with  . 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 He presented with worsening gait dysfunction and frequent falls. CT head showed new right cerebellum lesion. MRI brain revealed no new lesions but did have old cerebellum infarct. WBC 7.9K; hgb 14.2; Plts 122K; K 3.3; Cr 0.69; Phos 1.8; albumin 3.3 at d/c. He presents to SNF for short term rehab.  Today he reports no pain but nursing states he does have pain. He currently has no pain med ordered. No falls. No f/c, HA or dizziness. He is a poor historian due to altered MS. Hx obtained from chart  Hypertension - BP stable on norvasc 10 mg daily; cozaar 100 mg daily; maxzide 37.5/25 mg daily; toprol xl 200 mg daily   Chronic Diastolic heart failure - stable on spironolactone 25 mg daily; toprol xl 200 mg daily; maxzide 37.5/25 mg daily   Hypokalemia - stable on k+ 20 meq twice daily. K 3.3   DM - CBG 121 today. No recent A1c. He takes lantus 10 units nightly; metformin 500 mg twice daily; invokana 100 mg daily;  humalog SSI. 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 takes dexamethasone taper dose; zofran 8 mg every 8 hours as needed   Past Medical History:  Diagnosis Date  . CAD (coronary artery disease)   . Chest pain, atypical   . Dyspnea on exertion    2D Echo, 02/28/2012 - EF 60-65%, severe concentric hypertrophy, aortic valve moderately calcified,   . Hyperlipidemia   . Hypertension    Renal Doppler, 02/09/2009 - normal  . Non-insulin dependent type 2 diabetes mellitus ([place])   . PVC (premature ventricular contraction)    NUC, 02/09/2009 - No scintigraphic eivdence of inducible moycardial ischemia, post-stress EF 55%, EKG negative for ischemia, ne ECG changes    Past Surgical History:  Procedure Laterality Date  . SPINAL FUSION  2001    No care team member to display  Social History   Social History  . Marital status: Single    Spouse name: N/A  . Number of children: N/A  . Years of education: N/A   Occupational History  . Not on file.   Social History Main Topics  . Smoking status: Former Research scientist (life sciences)  . Smokeless tobacco: Never Used  . Alcohol use No  . Drug use: No  . Sexual activity: Not Currently   Other Topics Concern  .  Not on file   Social History Narrative  . No narrative on file     reports that he has quit smoking. He has never used smokeless tobacco. He reports that he does not drink alcohol or use drugs.  Family History  Problem Relation Age of Onset  . Stroke Mother 90  . Diabetes Mother   . Diabetes Sister   . Diabetes Brother   . Diabetes Brother   . Diabetes Sister    Family Status  Relation Status  . Mother Deceased at age 53  . Father Deceased at age 78   Sudden death  . Sister Alive  . Brother Alive  . Brother Alive  . Sister Alive    Immunization History  Administered Date(s) Administered  . Influenza, High Dose Seasonal PF 05/07/2015, 01/14/2016  . Pneumococcal Conjugate-13  02/18/2016    No Known Allergies  Medications: Patient's Medications  New Prescriptions   No medications on file  Previous Medications   AMLODIPINE (NORVASC) 10 MG TABLET    Take 10 mg by mouth daily.   ATORVASTATIN (LIPITOR) 40 MG TABLET    Take 40 mg by mouth daily.   BIOTIN 5 MG CAPS    Take 5 mg by mouth.   CALCIUM CARBONATE (CALCIUM 600) 600 MG TABS TABLET    Take 1,500 mg by mouth.   CANAGLIFLOZIN (INVOKANA) 100 MG TABS TABLET    Take 100 mg by mouth.   CHOLECALCIFEROL (VITAMIN D-1000 MAX ST) 1000 UNITS TABLET    Take by mouth.   DEXAMETHASONE (DECADRON) 2 MG TABLET    Take '4mg'$  (2 tabs) in the morning and '2mg'$  (1 tab) in the evening  1/19-1/24, then '2mg'$  twice daily 1/25-1/31, then '2mg'$  daily 2/1-2/7, then '2mg'$  every other day 2/8-2/14, then stop.   FOLIC ACID (FOLVITE) 209 MCG TABLET    Take by mouth.   INSULIN GLARGINE (LANTUS) 100 UNIT/ML INJECTION    Inject 10 Units into the skin at bedtime.   INSULIN LISPRO (HUMALOG) 100 UNIT/ML INJECTION    Inject into the skin.   LOSARTAN (COZAAR) 100 MG TABLET    Take 100 mg by mouth daily.   METFORMIN (GLUCOPHAGE) 500 MG TABLET    Take 500 mg by mouth 2 (two) times daily with a meal.   METOPROLOL (TOPROL-XL) 200 MG 24 HR TABLET    Take 200 mg by mouth.   ONDANSETRON (ZOFRAN) 8 MG TABLET    TAKE 1 TABLET BY MOUTH 2 TIMES DAILY FOR 2 DAYS AFTER CHEMOTHERAPY, THEN 1 TABLET 2 TIMES DAILY FOR NAUSEA AS NEEDED   PANTOPRAZOLE (PROTONIX) 40 MG TABLET    Take 40 mg by mouth.   POTASSIUM CHLORIDE SA (K-DUR,KLOR-CON) 20 MEQ TABLET    Take 20 mEq by mouth daily.   SENNA-DOCUSATE (SENOKOT-S) 8.6-50 MG TABLET    Take 2 tablets by mouth at bedtime.   SPECIALTY VITAMINS PRODUCTS (MG-PLUS PROTEIN PO)    1 capsule with a meal   TRIAMTERENE-HYDROCHLOROTHIAZIDE (MAXZIDE-25) 37.5-25 MG PER TABLET    Take 1 tablet by mouth daily.  Modified Medications   No medications on file  Discontinued Medications   No medications on file    Review of Systems  Unable to  perform ROS: Mental status change    Vitals:   04/26/16 1137  BP: (!) 160/85  Pulse: 69  Resp: 18  Temp: 98.6 F (37 C)  TempSrc: Oral  SpO2: 95%  Height: '5\' 11"'$  (1.803 m)   There is no height  or weight on file to calculate BMI.  Physical Exam  Constitutional: He appears well-developed.  Frail appearing in NAD  HENT:  Mouth/Throat: Oropharynx is clear and moist. No oropharyngeal exudate.  MMM; no oral thrush  Eyes: Pupils are equal, round, and reactive to light. No scleral icterus.  Neck: Neck supple. Carotid bruit is not present. No tracheal deviation present. No thyromegaly present.  Cardiovascular: Normal rate, regular rhythm and intact distal pulses.  Exam reveals no gallop and no friction rub.   Murmur (1/6 SEM) heard. no distal LE swelling. No calf TTP  Pulmonary/Chest: Effort normal and breath sounds normal. He has no wheezes. He has no rales. He exhibits no tenderness.  Abdominal: Soft. Bowel sounds are normal. He exhibits no distension, no abdominal bruit, no pulsatile midline mass and no mass. There is no hepatomegaly. There is no tenderness. There is no rebound and no guarding.  Musculoskeletal: He exhibits tenderness and deformity (LUE cx).  Lymphadenopathy:    He has no cervical adenopathy.  Neurological: He is alert.  Left foot 4/5 strength  Skin: Skin is warm and dry. No rash noted.  Psychiatric: He has a normal mood and affect. His behavior is normal.     Labs reviewed: Nursing Home on 04/25/2016  Component Date Value Ref Range Status  . Hemoglobin 04/22/2016 14.4  13.5 - 17.5 g/dL Final  . HCT 04/22/2016 43  41 - 53 % Final  . Platelets 04/22/2016 122* 150 - 399 K/L Final  . WBC 04/22/2016 7.9  10^3/mL Final  . Glucose 04/22/2016 137  mg/dL Final  . BUN 04/22/2016 22* 4 - 21 mg/dL Final  . Creatinine 04/22/2016 0.7  0.6 - 1.3 mg/dL Final  . Potassium 04/22/2016 3.3* 3.4 - 5.3 mmol/L Final  . Sodium 04/22/2016 140  137 - 147 mmol/L Final  .  Hemoglobin 04/21/2016 14.1  13.5 - 17.5 g/dL Final  . HCT 04/21/2016 42  41 - 53 % Final  . Platelets 04/21/2016 134* 150 - 399 K/L Final  . WBC 04/21/2016 8.5  10^3/mL Final  . Glucose 04/21/2016 191  mg/dL Final  . BUN 04/21/2016 28* 4 - 21 mg/dL Final  . Creatinine 04/21/2016 0.8  0.6 - 1.3 mg/dL Final  . Potassium 04/21/2016 3.7  3.4 - 5.3 mmol/L Final  . Sodium 04/21/2016 141  137 - 147 mmol/L Final  . Hemoglobin 04/20/2016 14.5  13.5 - 17.5 g/dL Final  . HCT 04/20/2016 42  41 - 53 % Final  . Platelets 04/20/2016 146* 150 - 399 K/L Final  . WBC 04/20/2016 6.8  10^3/mL Final  . Glucose 04/20/2016 253  mg/dL Final  . BUN 04/20/2016 27* 4 - 21 mg/dL Final  . Creatinine 04/20/2016 0.8  0.6 - 1.3 mg/dL Final  . Potassium 04/20/2016 3.6  3.4 - 5.3 mmol/L Final  . Sodium 04/20/2016 140  137 - 147 mmol/L Final  . Hemoglobin 04/19/2016 14.5  13.5 - 17.5 g/dL Final  . HCT 04/19/2016 43  41 - 53 % Final  . Neutrophils Absolute 04/19/2016 4  /L Final  . Platelets 04/19/2016 156  150 - 399 K/L Final  . WBC 04/19/2016 6.1  10^3/mL Final  . Glucose 04/19/2016 166  mg/dL Final  . BUN 04/19/2016 27* 4 - 21 mg/dL Final  . Creatinine 04/19/2016 1.0  0.6 - 1.3 mg/dL Final  . Potassium 04/19/2016 3.5  3.4 - 5.3 mmol/L Final  . Sodium 04/19/2016 140  137 - 147 mmol/L Final  . Alkaline  Phosphatase 04/19/2016 58  25 - 125 U/L Final  . ALT 04/19/2016 41* 10 - 40 U/L Final  . AST 04/19/2016 29  14 - 40 U/L Final  . Bilirubin, Total 04/19/2016 0.6  mg/dL Final  . INR 04/15/2016 1.1  0.9 - 1.1 Final  . Glucose 04/15/2016 259  mg/dL Final  . BUN 04/15/2016 40* 4 - 21 mg/dL Final  . Creatinine 04/15/2016 1.0  0.6 - 1.3 mg/dL Final  . Potassium 04/15/2016 4.4  3.4 - 5.3 mmol/L Final  . Sodium 04/15/2016 137  137 - 147 mmol/L Final  . Alkaline Phosphatase 04/15/2016 67  25 - 125 U/L Final  . ALT 04/15/2016 26  10 - 40 U/L Final  . AST 04/15/2016 16  14 - 40 U/L Final  . Bilirubin, Total 04/15/2016  0.7  mg/dL Final    No results found.   Assessment/Plan   ICD-9-CM ICD-10-CM   1. 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 Metastatic adenocarcinoma to brain ([place]) 198.3 C79.31    lung primary adenoCA; stage 4  3. Hypokalemia 276.8 E87.6   4. 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 Gastroesophageal reflux disease without esophagitis 530.81 K21.9   6. Chronic diastolic heart failure (HCC) 428.32 I50.32   7. History of stroke V12.54 Z86.73   8. Hypertensive heart disease with chronic diastolic congestive heart failure (HCC) 402.91 I11.0    428.32 I50.32     Start Oxy IR '5mg'$  #60 take 1 po q4hr prn mod-severe pain  Cont other meds as ordered. continue tapering DXM  PT/OT/ST as tolerated  F/u with H/O Dr Fatima Sanger as scheduled 04/28/16  Follow CBGs  t/c palliative care  Nutritional supplements as indicated  GOAL: short term rehab and d/c home when medically appropriate. Communicated with pt and nursing.  Will follow  Carnel Stegman S. Perlie Gold  [facility] [address] [place], [place] [address] [phone] Cell (Monday-Friday 8 AM - 5 PM) [phone] After 5 PM and follow prompts

## 2016-05-03 ENCOUNTER — Non-Acute Institutional Stay (SKILLED_NURSING_FACILITY): Payer: Medicare Other | Admitting: Internal Medicine

## 2016-05-03 ENCOUNTER — Other Ambulatory Visit: Payer: Self-pay | Admitting: Internal Medicine

## 2016-05-03 ENCOUNTER — Encounter: Payer: Self-pay | Admitting: Internal Medicine

## 2016-05-03 DIAGNOSIS — C7931 Secondary malignant neoplasm of brain: Secondary | ICD-10-CM | POA: Diagnosis not present

## 2016-05-03 DIAGNOSIS — R296 Repeated falls: Secondary | ICD-10-CM

## 2016-05-03 DIAGNOSIS — R4182 Altered mental status, unspecified: Secondary | ICD-10-CM

## 2016-05-03 DIAGNOSIS — E0965 Drug or chemical induced diabetes mellitus with hyperglycemia: Secondary | ICD-10-CM

## 2016-05-03 NOTE — Progress Notes (Signed)
Facility Location: [facility]  Room Number: 216   Code Status: Full Code   PCP: Greggory Brandy, MD  This is a comprehensive admission note to [facility] performed on this date less than 30 days from date of admission. Included are preadmission medical/surgical history;reconciled medication list; family history; social history and comprehensive review of systems.  Corrections and additions to the records were documented . Comprehensive physical exam was also performed. Additionally a clinical summary was entered for each active diagnosis pertinent to this admission in the Problem List to enhance continuity of care.  HPI: The patient was hospitalized 1/24-1/26/18 for wound care and pain control in the context of metastatic non-small cell lung cancer. The patient had been at another SNF but required admission for pain control & mental status changes. Patient had altered mental status with visual hallucinations. Imaging revealed no new brain metastases or CNS infection. He had intermittent visual hallucinations of a large bird in his room , but this did not distress him. The hallucinations are in the context of past radiosurgery to his brain lesions with subsequent necrosis as well as steroid therapy. Mental status changes also included pulling out his peripheral IVs. He did have increased pelvic & groin pain and worsening skin breakdown. At admission he had rash over the groin area suggestive of yeast infection. Condom cath was placed as the patient refused Foley catheter. Skin protocol was to include aloe Vesta foam cleansing, nystatin topically twice a day, miconazole 2% cream topically after each incontinent episode, and using underpads to wick moisture. He had a pre-existing stage II sacral ulcer. Wound care questioned possible herpetic lesions which were resolving.Allevyn sacral foam was employed to keep the skin dry in the sacral area. He can  position change were also recommended. 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 At admission he was on metformin and Invokana. He continued on his Lantus 10 units at night and was placed on sliding scale insulin supplementation. He exhibited mild thrombocytopenia with platelets down to 122,000. Potassium was 3.3, renal function was normal. Magnesium & hypokalemia were corrected. Since admission he's fallen twice here. PT/OT believes it's because he attempts to go the bathroom by himself due to urinary frequency. The urinary frequency is in the context of Invokana and oral steroids. At admission to this SNF Maxide 37.5/25 and potassium chloride were discontinued. Spironolactone was increased to twice a day. Sliding scale insulin was also discontinued and he was placed on Humalog 5 units before meals.  Past medical and surgical history: Reviewed; includes drug-induced diabetes, hypertension, dyslipidemia, and coronary artery disease. Surgeries include spinal fusion.  Social history: Reviewed. Former smoker. He does not drink alcohol. Family history: Reviewed.Although his diabetes is felt today drug-induced;there is a strong family history of diabetes.  Review of systems: He denies any active symptomatology. History questionable as he gave the date as 05/12/2016. He did identify the president. He also exhibited intermittently marked emotional lability with crying He states this sugars typically ran 170 at home.  Constitutional: No fever,significant weight change, fatigue  Eyes: No redness, discharge, pain, vision change ENT/mouth: No nasal congestion,  purulent discharge, earache,change in hearing ,sore throat  Cardiovascular: No chest pain, palpitations,paroxysmal nocturnal dyspnea, claudication, edema  Respiratory: No cough, sputum production,hemoptysis, DOE , significant snoring,apnea   Gastrointestinal: No heartburn,dysphagia,abdominal pain, nausea / vomiting,rectal bleeding, melena,change in bowels Genitourinary: No dysuria,hematuria, pyuria,  incontinence, nocturia Musculoskeletal:  No joint stiffness, joint swelling, weakness,pain Dermatologic: No rash, pruritus, change in appearance of skin Neurologic: No dizziness,headache,syncope, seizures, numbness , tingling Psychiatric: No significant anxiety , depression, insomnia, anorexia Endocrine: No change in hair/skin/ nails, excessive thirst, excessive hunger, excessive urination  Hematologic/lymphatic: No significant bruising, lymphadenopathy,abnormal bleeding Allergy/immunology: No itchy/ watery eyes, significant sneezing, urticaria, angioedema  Physical exam:  Pertinent or positive findings: He exhibited diaphoresis over his forehead but the room temperature was 85. He has pattern baldness. He has a full beard and mustache. Dental hygiene is fair to good except for one carious right upper posterior molar tooth. He has some inspiratory rhonchi over the right anterior chest. He also has rales and rhonchi in the right lower lobe greater than the left lower lobe. Breath sounds in the upper lobes posteriorly are decreased. There is a subcutaneous IV port in the right upper chest.  There is marked decreased range of motion of the left upper extremity. He has flexion contractures of the fingers of left hand . 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 There is no range of motion of the left lower extremity. There is marked muscular atrophy of the left lower extremity.  General appearance:Adequately nourished; no acute distress , increased work of breathing is present.   Lymphatic: No lymphadenopathy about the head, neck, axilla . Eyes: No conjunctival inflammation or lid edema is present. There is no scleral icterus. Ears:  External ear exam shows no significant lesions or deformities.   Nose:  External nasal examination shows no deformity  or inflammation. Nasal mucosa are pink and moist without lesions ,exudates Oral exam: lips and gums are healthy appearing.There is no oropharyngeal erythema or exudate . Neck:  No thyromegaly, masses, tenderness noted.    Heart: Heart sounds slightly distant. Normal rate and regular rhythm. S1 and S2 normal without gallop, murmur, click, rub .  Abdomen:Bowel sounds are normal. Abdomen is soft and nontender with no organomegaly, hernias,masses. GU: deferred. Extremities:  No cyanosis, clubbing,edema  Neurologic exam : Balance,Rhomberg,finger to nose testing could not be completed due to clinical state Skin: Warm & dry w/o tenting. No significant  rash.  See clinical summary under each active problem in the Problem List with associated updated therapeutic plan

## 2016-05-03 NOTE — Assessment & Plan Note (Addendum)
Cost of Invokana precludes its use in the nursing facility With his groin infections and sacral wound, this would not appear to be the best option to treat his steroid-induced hyperglycemia Glucoses should improve as the Decadron is weaned

## 2016-05-03 NOTE — Assessment & Plan Note (Addendum)
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

## 2016-05-03 NOTE — Assessment & Plan Note (Signed)
Defer to Dr Fatima Sanger

## 2016-05-03 NOTE — Patient Instructions (Signed)
See Current Assessment & Plan under specific Diagnosis

## 2016-05-04 LAB — BASIC METABOLIC PANEL
BUN: 34 mg/dL — AB (ref 4–21)
Creatinine: 0.8 mg/dL (ref 0.6–1.3)
GLUCOSE: 249 mg/dL
Potassium: 4.5 mmol/L (ref 3.4–5.3)
SODIUM: 143 mmol/L (ref 137–147)

## 2016-05-04 LAB — HEMOGLOBIN A1C: Hemoglobin A1C: 10.9

## 2016-05-05 ENCOUNTER — Encounter: Payer: Self-pay | Admitting: *Deleted

## 2016-05-09 DIAGNOSIS — I110 Hypertensive heart disease with heart failure: Secondary | ICD-10-CM | POA: Insufficient documentation

## 2016-06-02 DEATH — deceased

## 2021-02-22 ENCOUNTER — Non-Acute Institutional Stay: Payer: Self-pay | Admitting: Nurse Practitioner

## 2021-02-22 ENCOUNTER — Other Ambulatory Visit: Payer: Self-pay

## 2021-02-22 ENCOUNTER — Encounter: Payer: Self-pay | Admitting: Nurse Practitioner

## 2021-02-22 DIAGNOSIS — Z515 Encounter for palliative care: Secondary | ICD-10-CM

## 2021-02-22 NOTE — Progress Notes (Signed)
Error
